# Patient Record
Sex: Male | Born: 2000 | Race: Black or African American | Hispanic: No | Marital: Single | Smoking: Never smoker
Health system: Southern US, Community
[De-identification: ages and names within clinical notes are randomized; demographics above are authoritative.]

---
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
## 2019-02-02 IMAGING — DX DG CHEST 2V
2 series · 2 of 2 positions shown · non-contrast
Comparison: Chest radiograph dated 01/29/2018

CLINICAL DATA: 16-year-old male with chest pain.

EXAM:
CHEST - 2 VIEW

[chest pa]
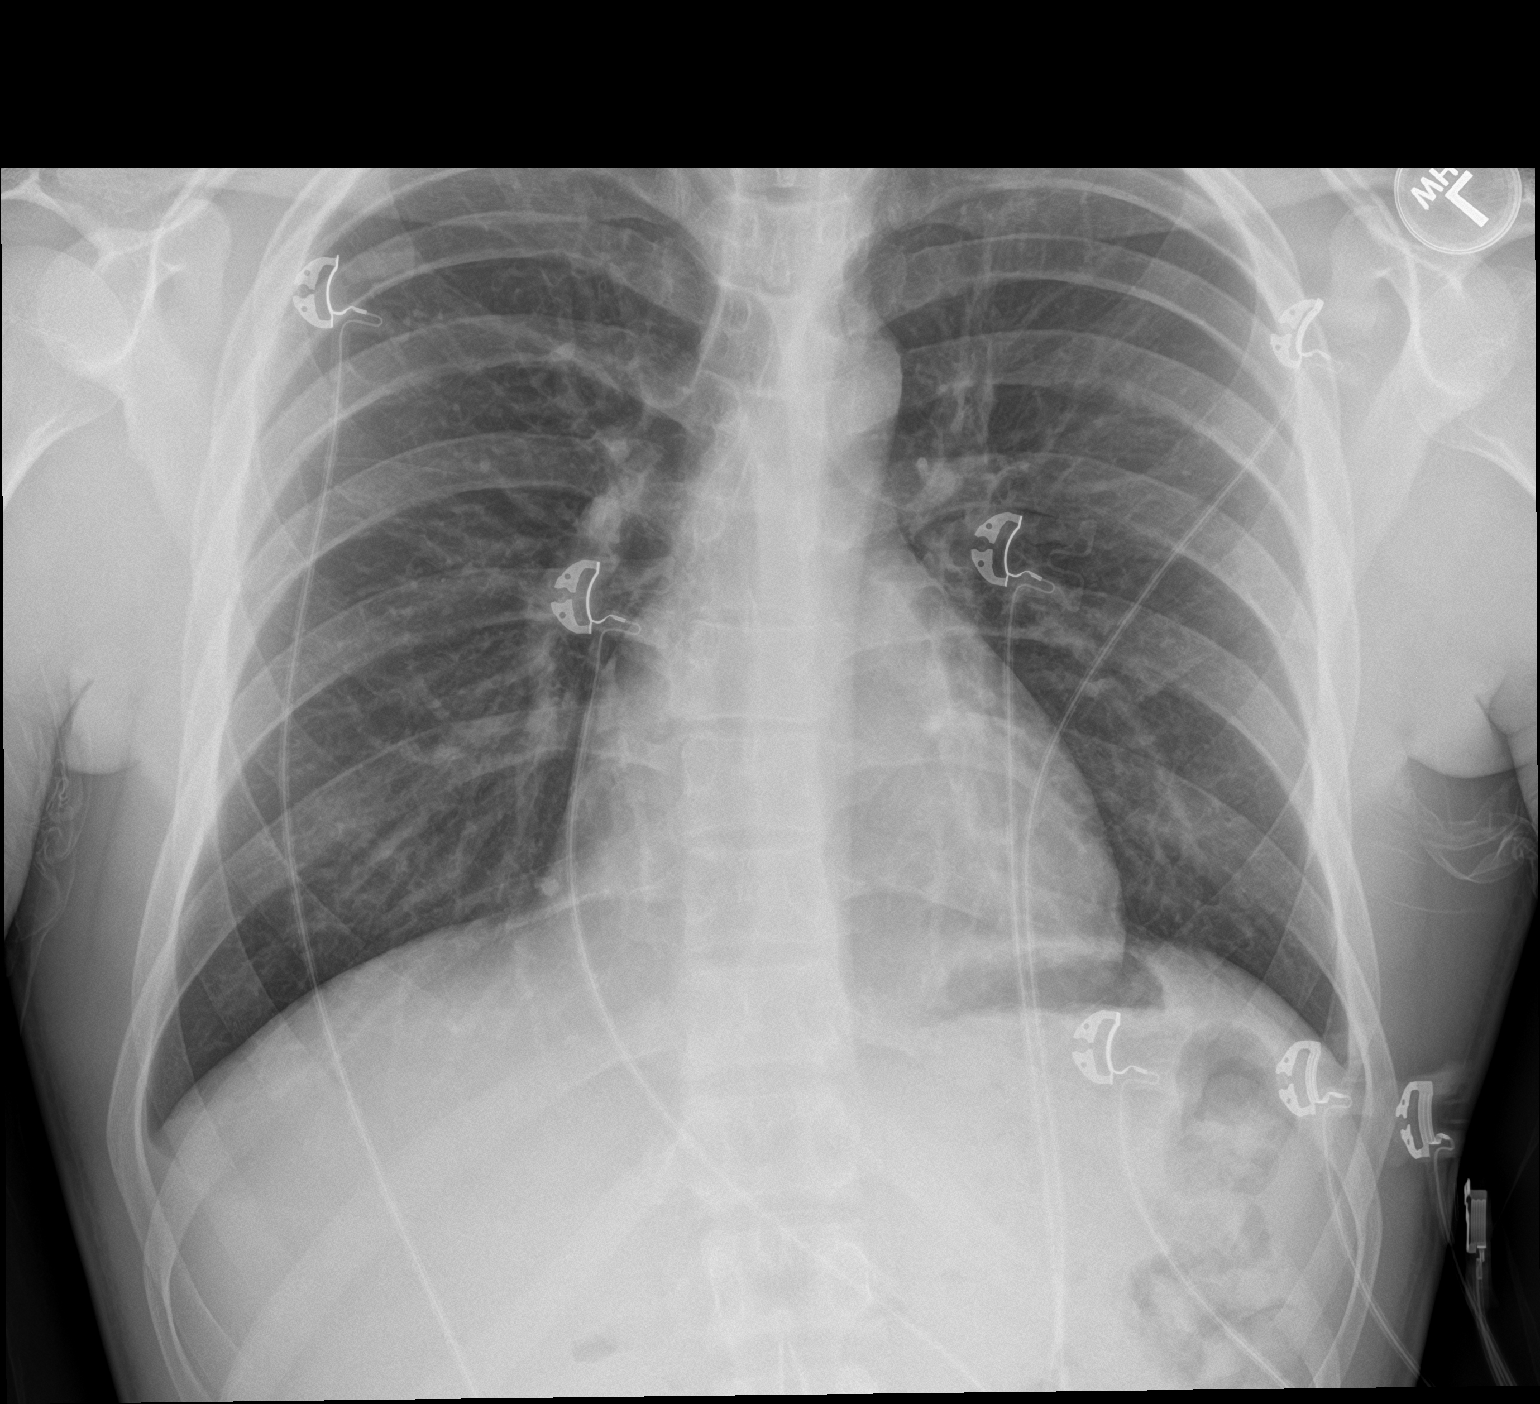

[chest lat]
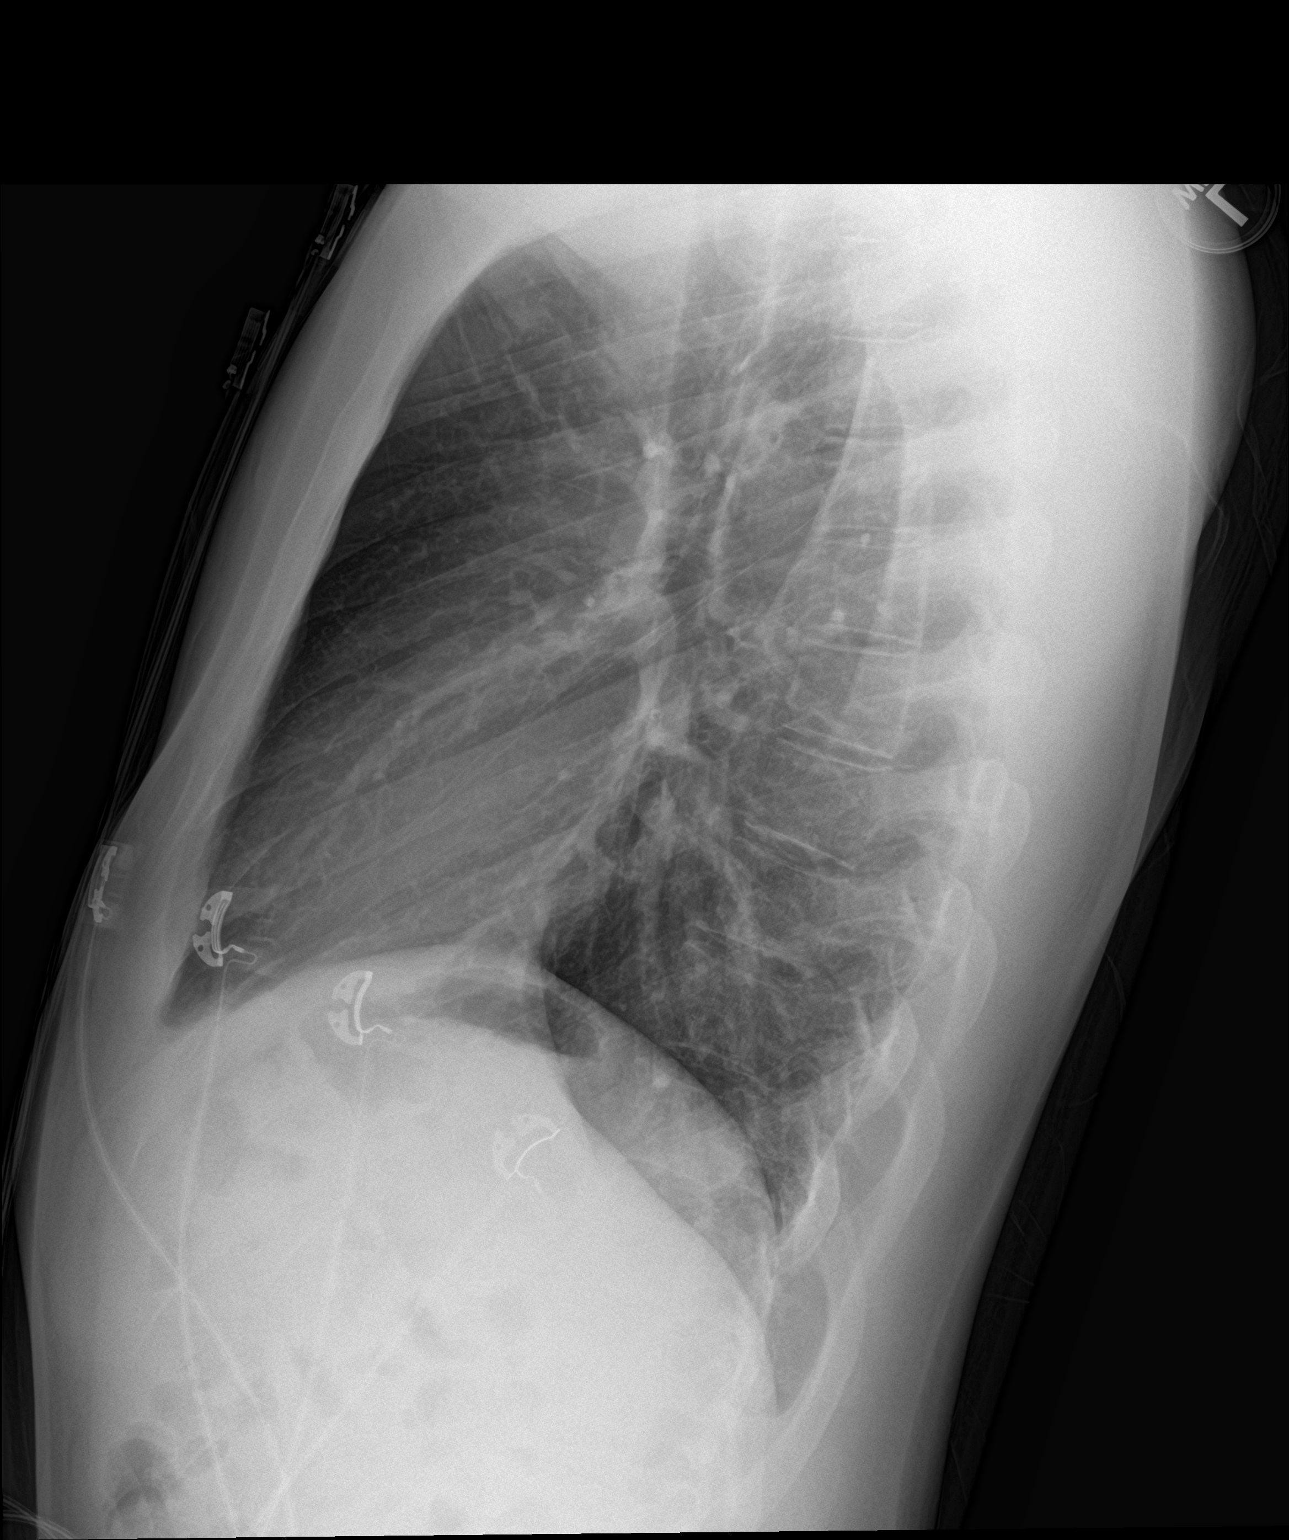

[2 of 2 positions shown; findings below may reference images not displayed]

FINDINGS: The heart size and mediastinal contours are within normal limits.
Both lungs are clear. The visualized skeletal structures are
unremarkable.
IMPRESSION: No active cardiopulmonary disease.
